# Patient Record
Sex: Male | Born: 2000 | Race: White | Hispanic: No | Marital: Single | Smoking: Never smoker
Health system: Southern US, Community
[De-identification: ages and names within clinical notes are randomized; demographics above are authoritative.]

## PROBLEM LIST (undated history)

## (undated) DIAGNOSIS — R569 Unspecified convulsions: Secondary | ICD-10-CM

## (undated) DIAGNOSIS — L03113 Cellulitis of right upper limb: Secondary | ICD-10-CM

## (undated) DIAGNOSIS — F909 Attention-deficit hyperactivity disorder, unspecified type: Secondary | ICD-10-CM

---
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
## 2019-01-07 ENCOUNTER — Ambulatory Visit (INDEPENDENT_AMBULATORY_CARE_PROVIDER_SITE_OTHER): Payer: Medicaid Other | Admitting: Orthopedic Surgery

## 2019-01-07 ENCOUNTER — Encounter: Payer: Self-pay | Admitting: Orthopedic Surgery

## 2019-01-07 ENCOUNTER — Ambulatory Visit (INDEPENDENT_AMBULATORY_CARE_PROVIDER_SITE_OTHER): Payer: Medicaid Other

## 2019-01-07 ENCOUNTER — Other Ambulatory Visit: Payer: Self-pay

## 2019-01-07 DIAGNOSIS — S82402A Unspecified fracture of shaft of left fibula, initial encounter for closed fracture: Secondary | ICD-10-CM

## 2019-01-07 DIAGNOSIS — S82202A Unspecified fracture of shaft of left tibia, initial encounter for closed fracture: Secondary | ICD-10-CM

## 2019-01-11 ENCOUNTER — Encounter: Payer: Self-pay | Admitting: Orthopedic Surgery

## 2019-01-11 NOTE — Progress Notes (Signed)
   Post-Op Visit Note   Patient: Brady Hayes           Date of Birth: 05-02-2000           MRN: [id number] Visit Date: 01/07/2019 PCP: Default, Provider, MD   Assessment & Plan:  Chief Complaint:  Chief Complaint  Patient presents with  . Left Leg - Follow-up   Visit Diagnoses:  1. Tibia/fibula fracture, left, closed, initial encounter     Plan: Patient is an 18 year old male who presents s/p left tibial IM nail on 12/16/2018.  Patient has been ambulating nonweightbearing.  His pain is improved significantly compared with the last office visit.  Has been taking aspirin twice a day with Robaxin.  He is not taking any pain medication.  He only takes occasional ibuprofen.  He has significant stiffness in the left ankle.  He also notes some mild knee pain and has 5 degrees of knee extension.  His x-rays look good today.  We will progress patient to partial weightbearing in a boot with 1 crutch.  He will increase his ankle range of motion exercises to 500 ankle pumps a day with a Thera-Band.  He has no calf tenderness.  Patient had some breakdown of the smaller incisions for the interlocking screws.  This is likely due to a reaction to the Vicryl suture.  The Vicryl sutures were identified and the incisions were anesthetized with 1% lidocaine.  Using small forceps and scissors, the sutures were removed and the incisions were approximated with gauze and bandages.  Patient will follow up in 1 week for recheck of incisions.  Follow-Up Instructions: No follow-ups on file.   Orders:  Orders Placed This Encounter  Procedures  . XR Tibia/Fibula Left   No orders of the defined types were placed in this encounter.   Imaging: No results found.  PMFS History: Patient Active Problem List   Diagnosis Date Noted  . Tibia/fibula fracture, left, closed, initial encounter 12/15/2018  . Appendicitis 06/13/2017   Past Medical History:  Diagnosis Date  . Attention deficit hyperactivity disorder  (ADHD)   . Cellulitis of right arm    lanced at MD office--?? caused by spider bite  . Seizures ([place])     History reviewed. No pertinent family history.  Past Surgical History:  Procedure Laterality Date  . LAPAROSCOPIC APPENDECTOMY N/A 06/13/2017   Procedure: APPENDECTOMY LAPAROSCOPIC;  Surgeon: Aviva Signs, MD;  Location: [facility];  Service: General;  Laterality: N/A;  . TIBIA IM NAIL INSERTION Left 12/16/2018   Procedure: INTRAMEDULLARY (IM) NAIL  LEFT TIBIAL/FIBULA;  Surgeon: Meredith Pel, MD;  Location: [place];  Service: Orthopedics;  Laterality: Left;   Social History   Occupational History  . Not on file  Tobacco Use  . Smoking status: Never Smoker  . Smokeless tobacco: Never Used  Substance and Sexual Activity  . Alcohol use: Not Currently  . Drug use: Not Currently  . Sexual activity: Not on file

## 2019-01-14 ENCOUNTER — Ambulatory Visit: Payer: Self-pay

## 2019-01-14 ENCOUNTER — Ambulatory Visit (INDEPENDENT_AMBULATORY_CARE_PROVIDER_SITE_OTHER): Payer: Medicaid Other | Admitting: Orthopedic Surgery

## 2019-01-14 ENCOUNTER — Other Ambulatory Visit: Payer: Self-pay

## 2019-01-14 ENCOUNTER — Encounter: Payer: Self-pay | Admitting: Orthopedic Surgery

## 2019-01-14 DIAGNOSIS — S82202A Unspecified fracture of shaft of left tibia, initial encounter for closed fracture: Secondary | ICD-10-CM

## 2019-01-14 DIAGNOSIS — S82402A Unspecified fracture of shaft of left fibula, initial encounter for closed fracture: Secondary | ICD-10-CM

## 2019-01-19 ENCOUNTER — Encounter: Payer: Self-pay | Admitting: Orthopedic Surgery

## 2019-01-19 NOTE — Progress Notes (Signed)
   Post-Op Visit Note   Patient: Brady Hayes           Date of Birth: [birth year]           MRN: [id number] Visit Date: 01/14/2019 PCP: Default, Provider, MD   Assessment & Plan:  Chief Complaint:  Chief Complaint  Patient presents with  . Left Leg - Follow-up   Visit Diagnoses:  1. Tibia/fibula fracture, left, closed, initial encounter     Plan: Patient is now 4 weeks out left tibial IM nail.  He heard a pop around the ankle.  On exam the syndesmosis is stable and radiographs show no significant change in fracture alignment.  Continue with progressive weightbearing in the fracture boot.  All incisions look reasonable.  4-week return for repeat radiographs and likely clinical release at that time negative Homans and no calf tenderness today.  Follow-Up Instructions: Return in about 4 weeks (around 02/11/2019).   Orders:  Orders Placed This Encounter  Procedures  . XR Tibia/Fibula Left   No orders of the defined types were placed in this encounter.   Imaging: No results found.  PMFS History: Patient Active Problem List   Diagnosis Date Noted  . Tibia/fibula fracture, left, closed, initial encounter 12/15/2018  . Appendicitis 06/13/2017   Past Medical History:  Diagnosis Date  . Attention deficit hyperactivity disorder (ADHD)   . Cellulitis of right arm    lanced at MD office--?? caused by spider bite  . Seizures ([place])     History reviewed. No pertinent family history.  Past Surgical History:  Procedure Laterality Date  . LAPAROSCOPIC APPENDECTOMY N/A 06/13/2017   Procedure: APPENDECTOMY LAPAROSCOPIC;  Surgeon: Aviva Signs, MD;  Location: [facility];  Service: General;  Laterality: N/A;  . TIBIA IM NAIL INSERTION Left 12/16/2018   Procedure: INTRAMEDULLARY (IM) NAIL  LEFT TIBIAL/FIBULA;  Surgeon: Meredith Pel, MD;  Location: [place];  Service: Orthopedics;  Laterality: Left;   Social History   Occupational History  . Not on file  Tobacco Use  . Smoking  status: Never Smoker  . Smokeless tobacco: Never Used  Substance and Sexual Activity  . Alcohol use: Not Currently  . Drug use: Not Currently  . Sexual activity: Not on file

## 2019-01-24 ENCOUNTER — Other Ambulatory Visit: Payer: Self-pay

## 2019-01-24 ENCOUNTER — Ambulatory Visit: Payer: Medicaid Other | Attending: Internal Medicine

## 2019-01-24 DIAGNOSIS — Z20828 Contact with and (suspected) exposure to other viral communicable diseases: Secondary | ICD-10-CM | POA: Diagnosis not present

## 2019-01-24 DIAGNOSIS — Z20822 Contact with and (suspected) exposure to covid-19: Secondary | ICD-10-CM

## 2019-01-25 LAB — NOVEL CORONAVIRUS, NAA: SARS-CoV-2, NAA: NOT DETECTED

## 2019-02-18 ENCOUNTER — Other Ambulatory Visit: Payer: Self-pay

## 2019-02-18 ENCOUNTER — Ambulatory Visit (INDEPENDENT_AMBULATORY_CARE_PROVIDER_SITE_OTHER): Payer: Medicaid Other | Admitting: Orthopedic Surgery

## 2019-02-18 ENCOUNTER — Ambulatory Visit (INDEPENDENT_AMBULATORY_CARE_PROVIDER_SITE_OTHER): Payer: Medicaid Other

## 2019-02-18 DIAGNOSIS — S82402A Unspecified fracture of shaft of left fibula, initial encounter for closed fracture: Secondary | ICD-10-CM

## 2019-02-18 DIAGNOSIS — S82202A Unspecified fracture of shaft of left tibia, initial encounter for closed fracture: Secondary | ICD-10-CM

## 2019-02-24 ENCOUNTER — Encounter: Payer: Self-pay | Admitting: Orthopedic Surgery

## 2019-02-24 NOTE — Progress Notes (Signed)
   Post-Op Visit Note   Patient: Brady Hayes           Date of Birth: [birth year]           MRN: [id number] Visit Date: 02/18/2019 PCP: Default, Provider, MD   Assessment & Plan:  Chief Complaint:  Chief Complaint  Patient presents with  . Left Leg - Follow-up   Visit Diagnoses:  1. Tibia/fibula fracture, left, closed, initial encounter     Plan: [place] is a patient is now about 9 weeks out left tibial IM nail.  He is doing well.  On exam he has excellent range of motion of the knee and ankle.  He is walking full weightbearing with minimal limp.  Incisions have healed nicely.  Radiographs look good.  Plan is to continue weightbearing as tolerated.  Mild discomfort with increased activity should improve over time follow-up with [place] as needed.  No calf tenderness and negative Homans today.  Follow-Up Instructions: No follow-ups on file.   Orders:  Orders Placed This Encounter  Procedures  . XR Tibia/Fibula Left   No orders of the defined types were placed in this encounter.   Imaging: No results found.  PMFS History: Patient Active Problem List   Diagnosis Date Noted  . Tibia/fibula fracture, left, closed, initial encounter 12/15/2018  . Appendicitis 06/13/2017   Past Medical History:  Diagnosis Date  . Attention deficit hyperactivity disorder (ADHD)   . Cellulitis of right arm    lanced at MD office--?? caused by spider bite  . Seizures ([facility])     History reviewed. No pertinent family history.  Past Surgical History:  Procedure Laterality Date  . LAPAROSCOPIC APPENDECTOMY N/A 06/13/2017   Procedure: APPENDECTOMY LAPAROSCOPIC;  Surgeon: Franky Macho, MD;  Location: [facility];  Service: General;  Laterality: N/A;  . TIBIA IM NAIL INSERTION Left 12/16/2018   Procedure: INTRAMEDULLARY (IM) NAIL  LEFT TIBIAL/FIBULA;  Surgeon: Cammy Copa, MD;  Location: [facility];  Service: Orthopedics;  Laterality: Left;   Social History   Occupational History  . Not on file    Tobacco Use  . Smoking status: Never Smoker  . Smokeless tobacco: Never Used  Substance and Sexual Activity  . Alcohol use: Not Currently  . Drug use: Not Currently  . Sexual activity: Not on file

## 2019-04-04 ENCOUNTER — Ambulatory Visit: Payer: Medicaid Other | Attending: Internal Medicine

## 2019-04-04 ENCOUNTER — Other Ambulatory Visit: Payer: Self-pay

## 2019-04-04 DIAGNOSIS — Z20822 Contact with and (suspected) exposure to covid-19: Secondary | ICD-10-CM

## 2019-04-05 LAB — NOVEL CORONAVIRUS, NAA: SARS-CoV-2, NAA: DETECTED — AB

## 2019-05-15 ENCOUNTER — Telehealth: Payer: Self-pay | Admitting: Orthopedic Surgery

## 2019-05-15 NOTE — Telephone Encounter (Signed)
Received call from patient. He is needing to get copy of his records for the [employer]. I advised him, need to sign release form and I would have his records ready for him. Callback [phone]

## 2019-10-04 DIAGNOSIS — B372 Candidiasis of skin and nail: Secondary | ICD-10-CM | POA: Diagnosis not present

## 2019-10-04 DIAGNOSIS — R21 Rash and other nonspecific skin eruption: Secondary | ICD-10-CM | POA: Diagnosis not present

## 2020-02-27 ENCOUNTER — Other Ambulatory Visit: Payer: Medicaid Other

## 2020-02-27 DIAGNOSIS — Z20822 Contact with and (suspected) exposure to covid-19: Secondary | ICD-10-CM | POA: Diagnosis not present

## 2020-02-29 LAB — NOVEL CORONAVIRUS, NAA: SARS-CoV-2, NAA: DETECTED — AB

## 2020-02-29 LAB — SARS-COV-2, NAA 2 DAY TAT

## 2021-03-03 ENCOUNTER — Other Ambulatory Visit: Payer: Self-pay

## 2021-03-03 ENCOUNTER — Ambulatory Visit (INDEPENDENT_AMBULATORY_CARE_PROVIDER_SITE_OTHER): Payer: Medicaid Other | Admitting: Family Medicine

## 2021-03-03 ENCOUNTER — Encounter: Payer: Self-pay | Admitting: Family Medicine

## 2021-03-03 DIAGNOSIS — R21 Rash and other nonspecific skin eruption: Secondary | ICD-10-CM | POA: Diagnosis not present

## 2021-03-03 MED ORDER — BETAMETHASONE VALERATE 0.1 % EX OINT: 1.0000 "application " | TOPICAL_OINTMENT | Freq: Two times a day (BID) | CUTANEOUS | 2 refills | Status: DC

## 2021-03-03 NOTE — Progress Notes (Signed)
Subjective:  Patient ID: Brady Hayes, male    DOB: [birth year]  Age: 21 y.o. MRN: [id number]  CC: Establish care; rash  HPI Brady Hayes is a 21 y.o. male presents to the clinic today to establish care with me.  He complains of persistent rash.  Patient states that he has had a rash on his lower abdomen for over a year.  He was previously treated at [facility] urgent care.  Was thought to be candidiasis and he was treated with antifungals.  He had no improvement with the medication that he was prescribed.  He states that this has slowly improved over time but has not resolved.  He has an area that is raised, dry and erythematous on his lower abdomen.  He also has a raised area of erythema at the left antecubital fossa.  It is not particular troublesome or itchy.  However, it is still persistent.  Patient also complains of transient pains of the low back radiating anteriorly.  Last for seconds and then resolves.  Denies chest pain or shortness of breath.  No other reported symptoms.  No other complaints at this time.   PMH, Surgical Hx, Family Hx, Social History reviewed and updated as below.  Past Medical History:  Diagnosis Date   Seizures ([place])    As a child; no current meds   Past Surgical History:  Procedure Laterality Date   LAPAROSCOPIC APPENDECTOMY N/A 06/13/2017   Procedure: APPENDECTOMY LAPAROSCOPIC;  Surgeon: Aviva Signs, MD;  Location: [facility];  Service: General;  Laterality: N/A;   TIBIA IM NAIL INSERTION Left 12/16/2018   Procedure: INTRAMEDULLARY (IM) NAIL  LEFT TIBIAL/FIBULA;  Surgeon: Meredith Pel, MD;  Location: [place];  Service: Orthopedics;  Laterality: Left;   Family History  Problem Relation Age of Onset   Diabetes Mother    Heart disease Father    Diabetes Father    Diabetes Maternal Grandmother    Diabetes Maternal Grandfather    Social History   Tobacco Use   Smoking status: Never   Smokeless tobacco: Never  Substance Use Topics   Alcohol use:  Not Currently    Review of Systems Per HPI Objective:   Today's Vitals: BP 130/78    Pulse 82    Temp 97.7 F (36.5 C)    Ht 5\' 8"  (1.727 m)    Wt 219 lb 9.6 oz (99.6 kg)    SpO2 99%    BMI 33.39 kg/m   Physical Exam Vitals and nursing note reviewed.  Constitutional:      General: He is not in acute distress.    Appearance: Normal appearance. He is obese. He is not ill-appearing.  HENT:     Head: Normocephalic and atraumatic.  Eyes:     General:        Right eye: No discharge.        Left eye: No discharge.     Conjunctiva/sclera: Conjunctivae normal.  Cardiovascular:     Rate and Rhythm: Normal rate and regular rhythm.     Heart sounds: No murmur heard. Pulmonary:     Effort: Pulmonary effort is normal.     Breath sounds: Normal breath sounds. No wheezing, rhonchi or rales.  Abdominal:       Comments: Dry eye, scaly area with erythema at the labeled location.  Skin:    Comments: Raised papular rash noted at the left antecubital fossa.  Neurological:     Mental Status: He is alert.  Psychiatric:  Mood and Affect: Mood normal.        Behavior: Behavior normal.     Assessment & Plan:   Problem List Items Addressed This Visit       Musculoskeletal and Integument   Rash    Suspect atopic dermatitis.  Treating with betamethasone.       Meds ordered this encounter  Medications   betamethasone valerate ointment (VALISONE) 0.1 %    Sig: Apply 1 application topically 2 (two) times daily. Do not use for more than 2 weeks consecutively.    Dispense:  30 g    Refill:  2     Follow-up: Return in about 1 year (around 03/03/2022), or if symptoms worsen or fail to improve.  [place]

## 2021-03-03 NOTE — Patient Instructions (Signed)
Use the medication as prescribed.  You can also use it for the areas on your hand.  Follow up annually or sooner if needed.  Take care  Dr. Adriana Simas

## 2021-03-03 NOTE — Assessment & Plan Note (Signed)
Suspect atopic dermatitis.  Treating with betamethasone.

## 2021-03-05 IMAGING — DX DG TIBIA/FIBULA 2V*L*
4 series · 4 of 4 positions shown · non-contrast
Comparison: None.

CLINICAL DATA: Item nil placement in the left tibia

EXAM:
DG C-ARM 1-60 MIN; LEFT TIBIA AND FIBULA - 2 VIEW
FLUOROSCOPY TIME:  Fluoroscopy Time:  2 minutes 19 seconds

[tibia ap (1 of 2)]
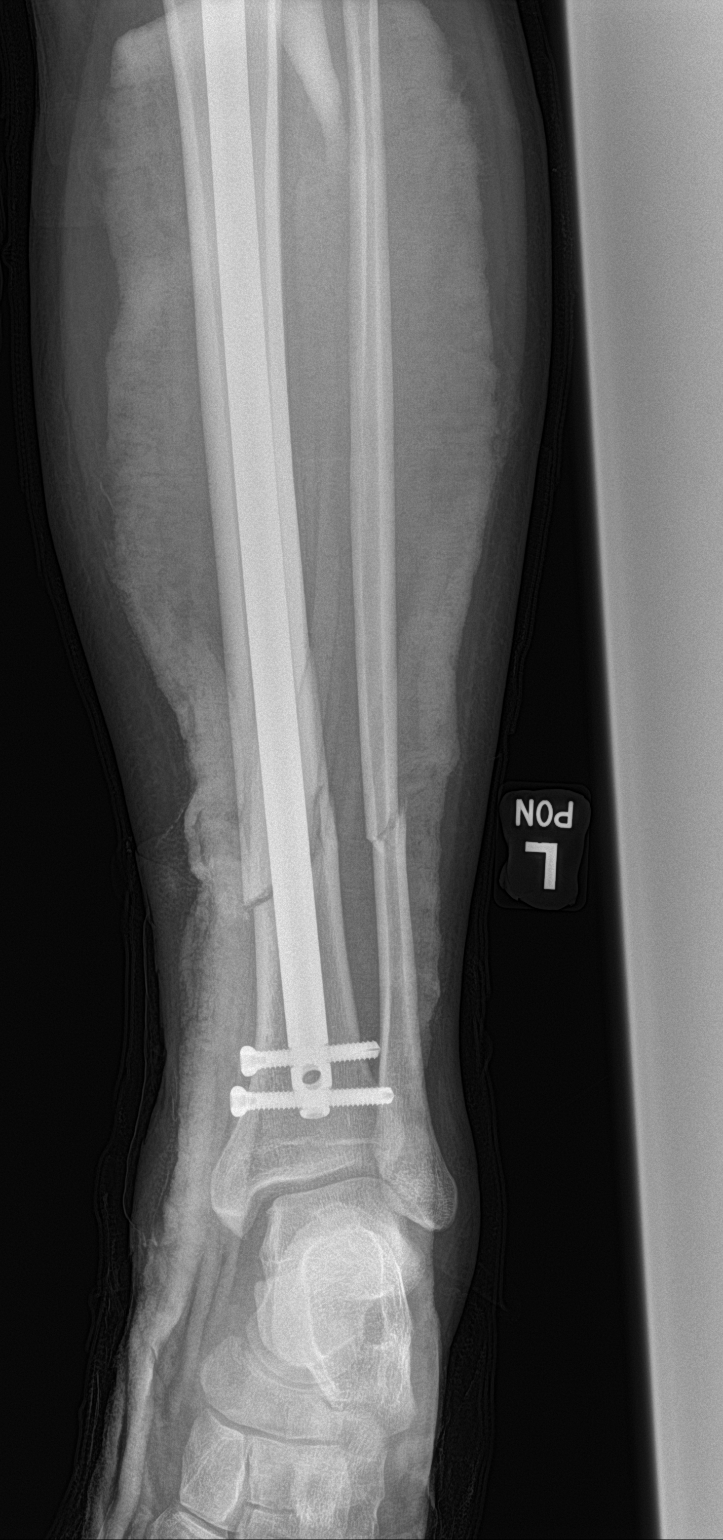

[tibia ap (2 of 2)]
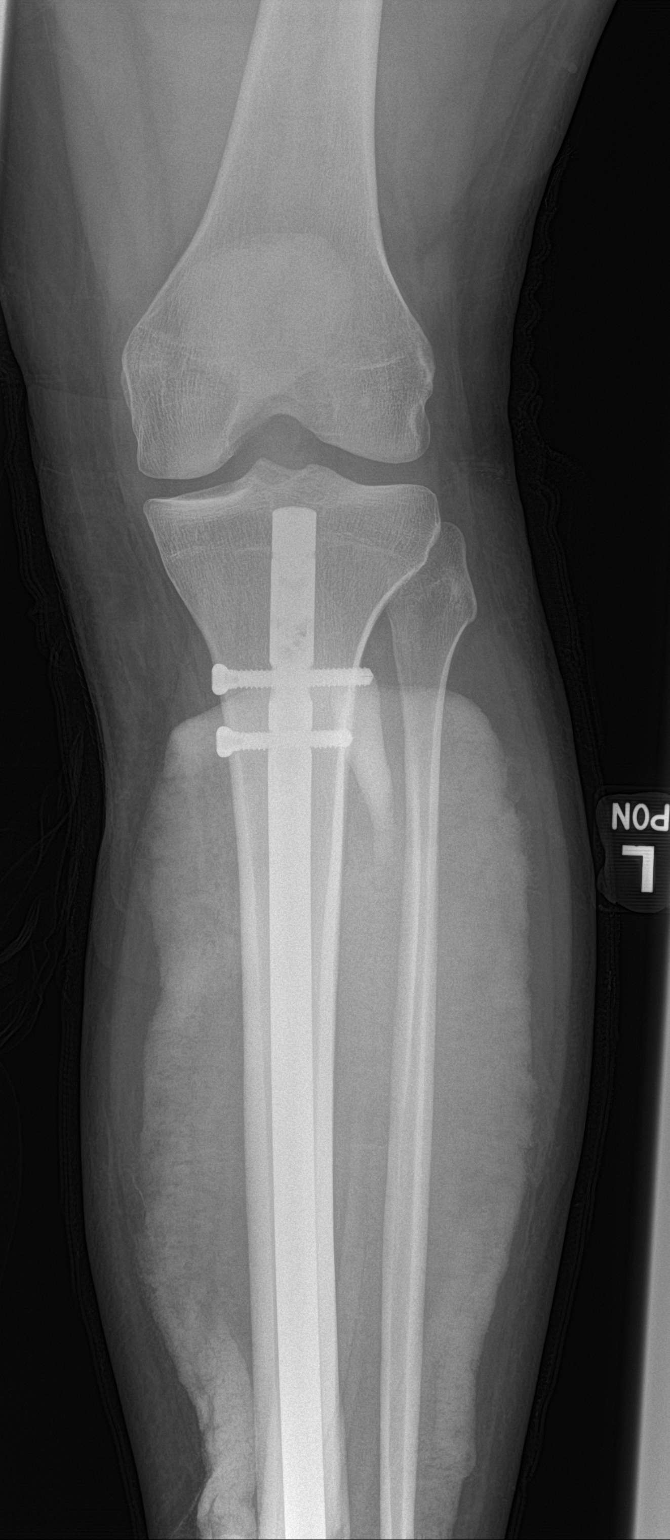

[tibia lat (1 of 2)]
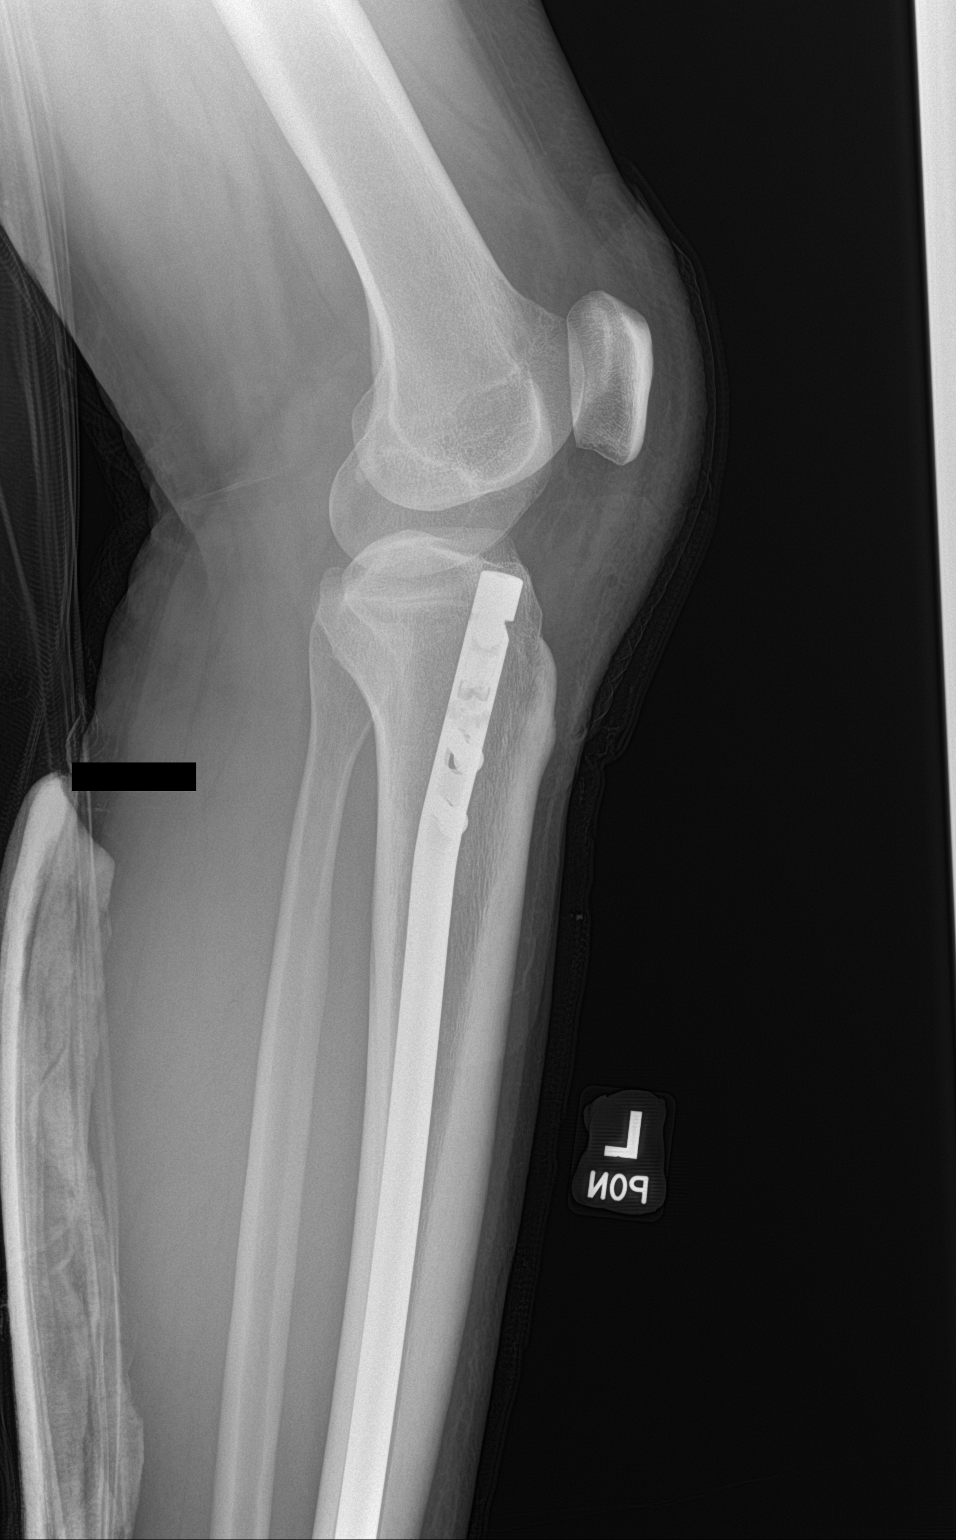

[tibia lat (2 of 2)]
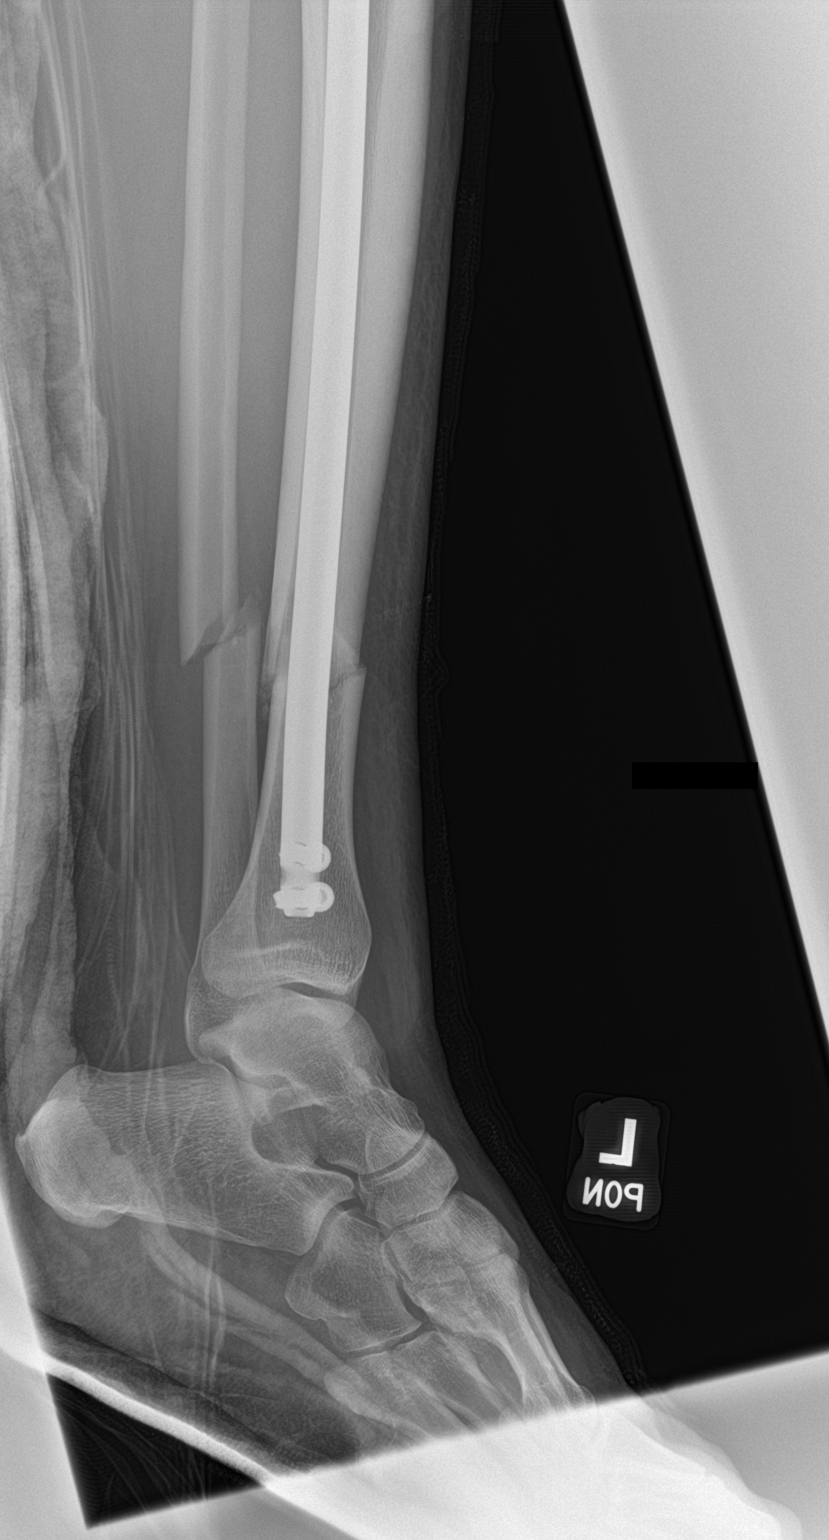

[4 of 4 positions shown; findings below may reference images not displayed]

FINDINGS: Multiple intraoperative fluoroscopic spot images are provided.
Oblique distal tibial diaphysis fracture transfixed with a
intramedullary nail and interlocking screws in near anatomic
alignment. Mildly displaced oblique distal fibular diaphysis
fracture with 4 mm of anterior displacement.
IMPRESSION: Intraoperative localization.

## 2021-03-05 IMAGING — RF DG C-ARM 1-60 MIN
1 series · 6 of 6 positions shown · non-contrast
Comparison: None.

CLINICAL DATA: Item nil placement in the left tibia

EXAM:
DG C-ARM 1-60 MIN; LEFT TIBIA AND FIBULA - 2 VIEW
FLUOROSCOPY TIME:  Fluoroscopy Time:  2 minutes 19 seconds

[Series 1: run · 6 of 6 slices shown]
[im 1/6]
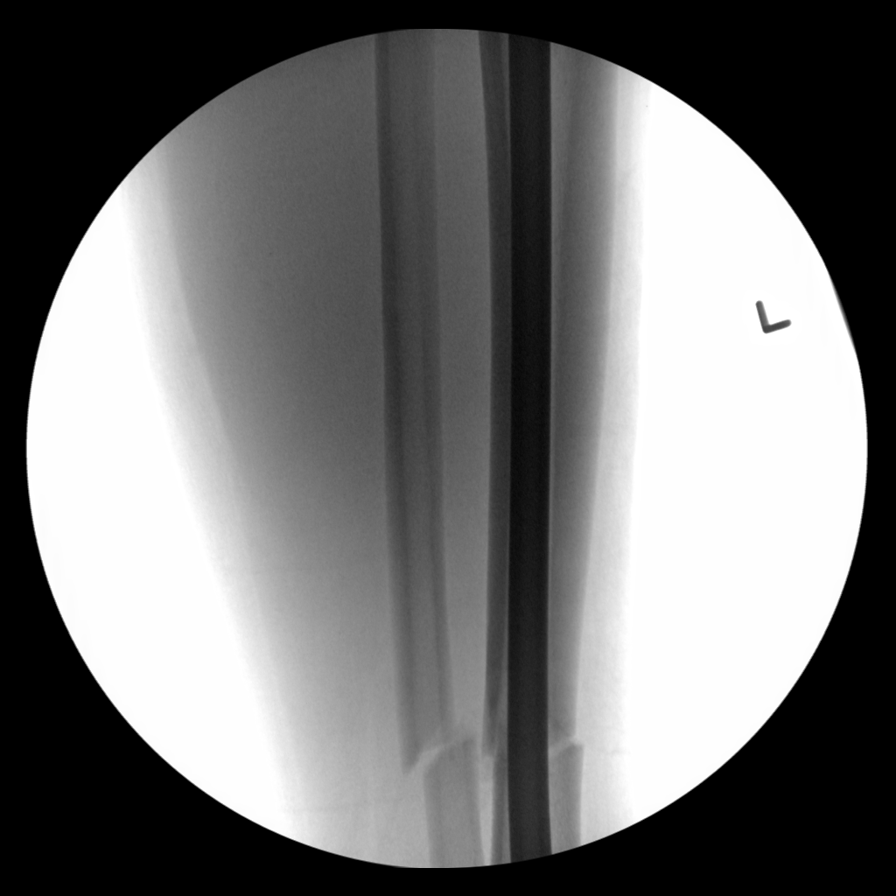
[im 2/6]
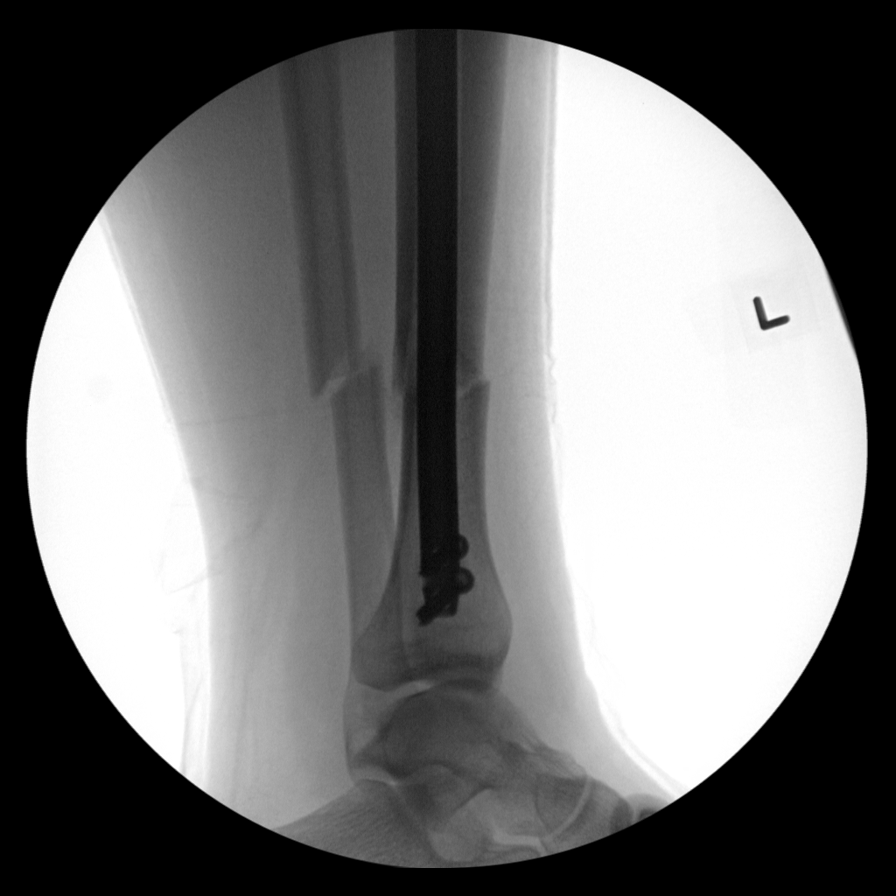
[im 3/6]
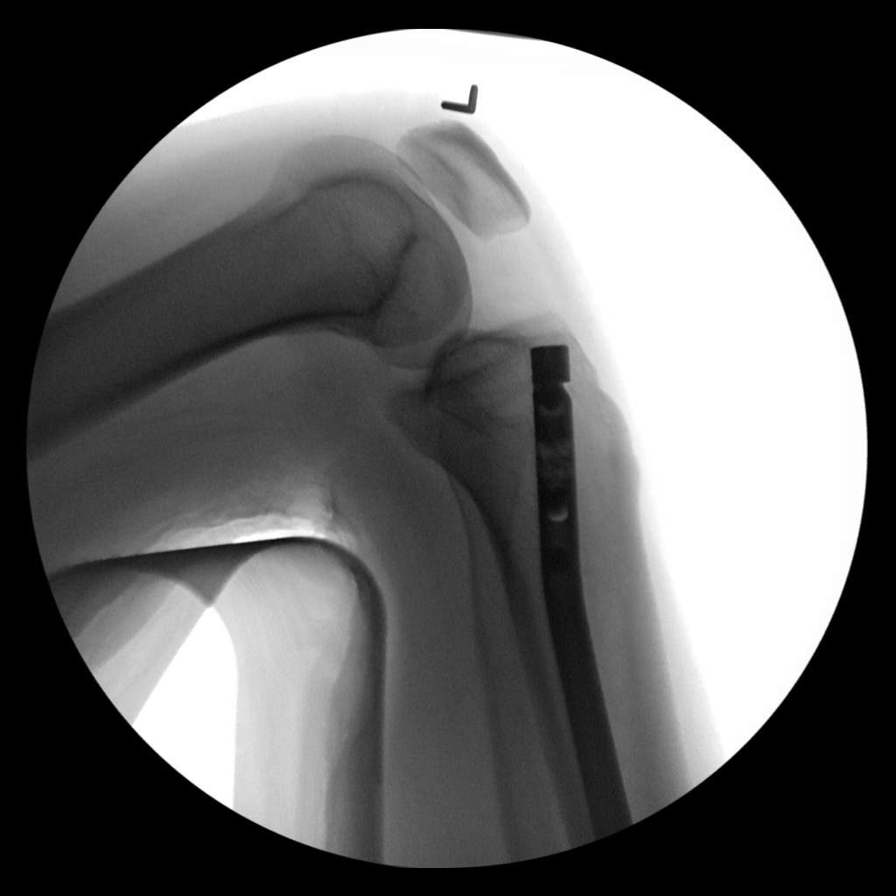
[im 4/6]
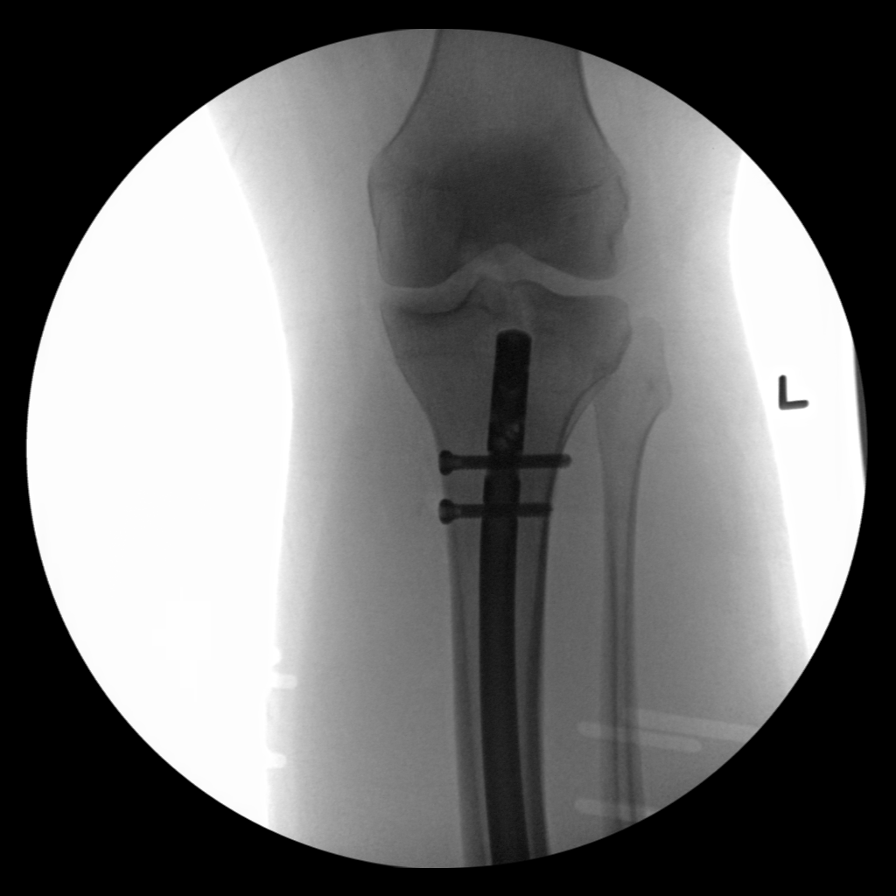
[im 5/6]
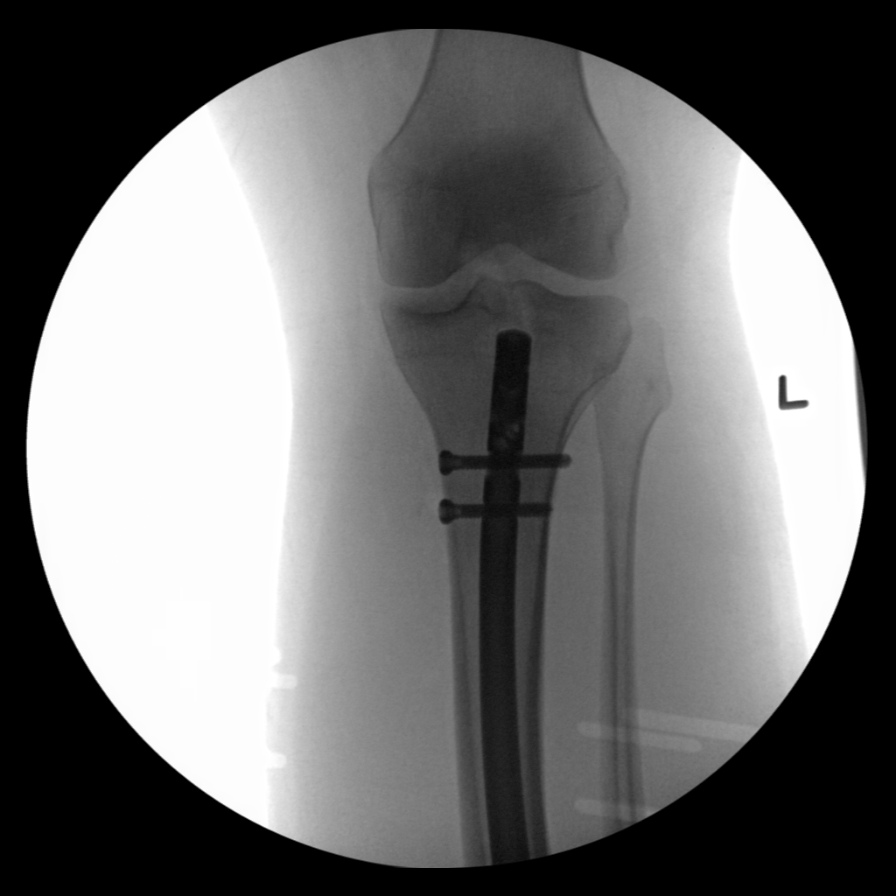
[im 6/6]
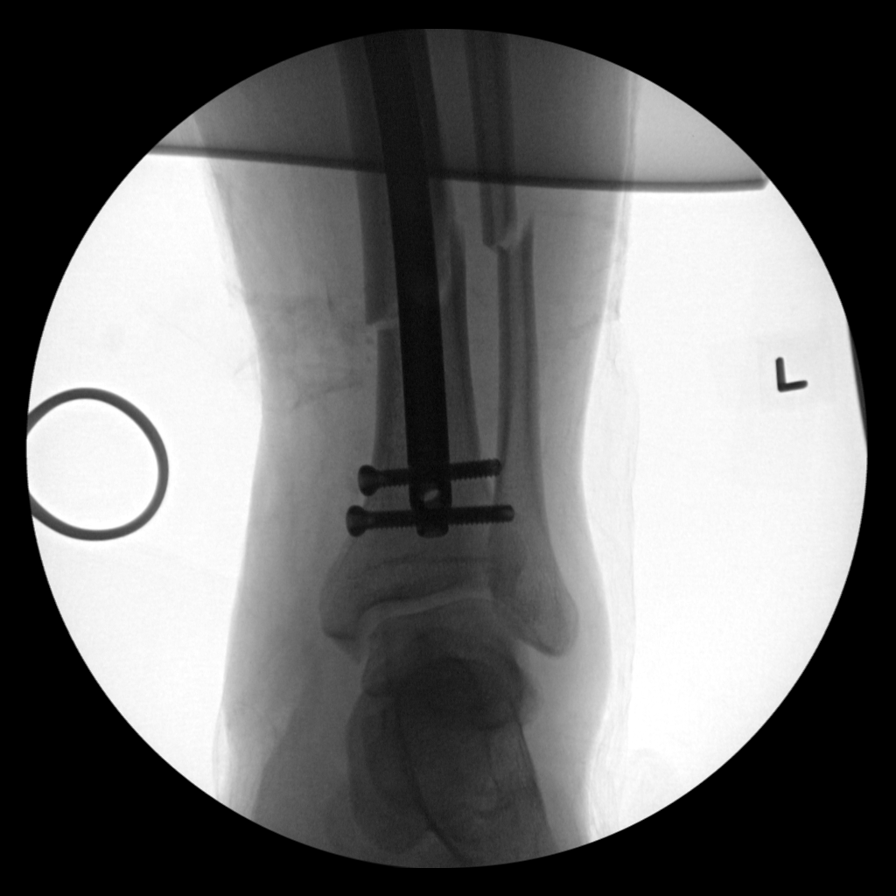

[6 of 6 positions shown; findings below may reference images not displayed]

FINDINGS: Multiple intraoperative fluoroscopic spot images are provided.
Oblique distal tibial diaphysis fracture transfixed with a
intramedullary nail and interlocking screws in near anatomic
alignment. Mildly displaced oblique distal fibular diaphysis
fracture with 4 mm of anterior displacement.
IMPRESSION: Intraoperative localization.

## 2021-09-07 ENCOUNTER — Emergency Department (HOSPITAL_COMMUNITY): Payer: 59

## 2021-09-07 ENCOUNTER — Encounter (HOSPITAL_COMMUNITY): Payer: Self-pay

## 2021-09-07 ENCOUNTER — Other Ambulatory Visit: Payer: Self-pay

## 2021-09-07 ENCOUNTER — Emergency Department (HOSPITAL_COMMUNITY)
Admission: EM | Admit: 2021-09-07 | Discharge: 2021-09-07 | Disposition: A | Discharge status: Home / Self Care | Payer: 59 | Attending: Emergency Medicine | Admitting: Emergency Medicine

## 2021-09-07 DIAGNOSIS — N200 Calculus of kidney: Secondary | ICD-10-CM

## 2021-09-07 DIAGNOSIS — N132 Hydronephrosis with renal and ureteral calculous obstruction: Secondary | ICD-10-CM | POA: Insufficient documentation

## 2021-09-07 DIAGNOSIS — R109 Unspecified abdominal pain: Secondary | ICD-10-CM | POA: Diagnosis present

## 2021-09-07 LAB — URINALYSIS, ROUTINE W REFLEX MICROSCOPIC
Bacteria, UA: NONE SEEN
Bilirubin Urine: NEGATIVE
Glucose, UA: NEGATIVE mg/dL
Ketones, ur: NEGATIVE mg/dL
Leukocytes,Ua: NEGATIVE
Nitrite: NEGATIVE
Protein, ur: 30 mg/dL — AB
RBC / HPF: >50 RBC/hpf — ABNORMAL HIGH (ref 0–5)
Specific Gravity, Urine: 1.031 — ABNORMAL HIGH (ref 1.005–1.030)
pH: 5 (ref 5.0–8.0)

## 2021-09-07 LAB — BASIC METABOLIC PANEL
Anion gap: 6 (ref 5–15)
BUN: 13 mg/dL (ref 6–20)
CO2: 26 mmol/L (ref 22–32)
Calcium: 9 mg/dL (ref 8.9–10.3)
Chloride: 103 mmol/L (ref 98–111)
Creatinine, Ser: 0.99 mg/dL (ref 0.61–1.24)
GFR, Estimated: >60 mL/min (ref >60)
Glucose, Bld: 138 mg/dL — ABNORMAL HIGH (ref 70–99)
Potassium: 3.9 mmol/L (ref 3.5–5.1)
Sodium: 135 mmol/L (ref 135–145)

## 2021-09-07 LAB — CBC WITH DIFFERENTIAL/PLATELET
Abs Immature Granulocytes: 0.03 10*3/uL (ref 0.00–0.07)
Basophils Absolute: 0.1 10*3/uL (ref 0.0–0.1)
Basophils Relative: 1 %
Eosinophils Absolute: 0.2 10*3/uL (ref 0.0–0.5)
Eosinophils Relative: 3 %
HCT: 41.5 % (ref 39.0–52.0)
Hemoglobin: 14.3 g/dL (ref 13.0–17.0)
Immature Granulocytes: 0 %
Lymphocytes Relative: 40 %
Lymphs Abs: 3.2 10*3/uL (ref 0.7–4.0)
MCH: 28.9 pg (ref 26.0–34.0)
MCHC: 34.5 g/dL (ref 30.0–36.0)
MCV: 84 fL (ref 80.0–100.0)
Monocytes Absolute: 0.6 10*3/uL (ref 0.1–1.0)
Monocytes Relative: 7 %
Neutro Abs: 3.9 10*3/uL (ref 1.7–7.7)
Neutrophils Relative %: 49 %
Platelets: 228 10*3/uL (ref 150–400)
RBC: 4.94 MIL/uL (ref 4.22–5.81)
RDW: 12.1 % (ref 11.5–15.5)
WBC: 8 10*3/uL (ref 4.0–10.5)
nRBC: 0 % (ref 0.0–0.2)

## 2021-09-07 MED ORDER — KETOROLAC TROMETHAMINE 30 MG/ML IJ SOLN
30.0000 mg | Freq: Once | INTRAMUSCULAR | Status: AC
  Administered 2021-09-07: 30 mg via INTRAVENOUS
  Filled 2021-09-07: qty 1

## 2021-09-07 MED ORDER — HYDROMORPHONE HCL 1 MG/ML IJ SOLN
1.0000 mg | Freq: Once | INTRAMUSCULAR | Status: AC
  Administered 2021-09-07: 1 mg via INTRAVENOUS
  Filled 2021-09-07: qty 1

## 2021-09-07 MED ORDER — ONDANSETRON HCL 4 MG PO TABS: 4.0000 mg | ORAL_TABLET | Freq: Four times a day (QID) | ORAL | 0 refills | Status: DC

## 2021-09-07 MED ORDER — OXYCODONE-ACETAMINOPHEN 5-325 MG PO TABS: 1.0000 | ORAL_TABLET | ORAL | 0 refills | Status: AC | PRN

## 2021-09-07 MED ORDER — TAMSULOSIN HCL 0.4 MG PO CAPS: 0.4000 mg | ORAL_CAPSULE | Freq: Every day | ORAL | 0 refills | Status: DC

## 2021-09-07 MED ORDER — ONDANSETRON HCL 4 MG/2ML IJ SOLN
4.0000 mg | Freq: Once | INTRAMUSCULAR | Status: AC
  Administered 2021-09-07: 4 mg via INTRAVENOUS
  Filled 2021-09-07: qty 2

## 2021-09-07 NOTE — ED Provider Notes (Signed)
[facility] EMERGENCY DEPARTMENT Provider Note   CSN: [id number] Arrival date & time: 09/07/21  1030     History  Chief Complaint  Patient presents with   Flank Pain    Brady Hayes is a 21 y.o. male.   Flank Pain Pertinent negatives include no chest pain, no abdominal pain, no headaches and no shortness of breath.       Brady Hayes is a 21 y.o. male who presents to the Emergency Department complaining of left flank pain of sudden onset earlier today.  He describes sharp pain to his left back that is constant.  Slightly feels like it is radiating toward his lower abdomen.  Some discomfort with urinating, but able to void.  No burning with urination, swelling or pain to his penis or testicles.  Symptoms associated with nausea, no vomiting.  States pain is so severe that he feels like he is going to pass out.  Both parents have history of multiple kidney stones.     Home Medications Prior to Admission medications   Medication Sig Start Date End Date Taking? Authorizing Provider  ondansetron (ZOFRAN) 4 MG tablet Take 1 tablet (4 mg total) by mouth every 6 (six) hours. As needed for nausea/vomiting 09/07/21  Yes Ioane Bhola, PA-C  oxyCODONE-acetaminophen (PERCOCET/ROXICET) 5-325 MG tablet Take 1 tablet by mouth every 4 (four) hours as needed. 09/07/21  Yes Uday Jantz, PA-C  tamsulosin (FLOMAX) 0.4 MG CAPS capsule Take 1 capsule (0.4 mg total) by mouth daily. 09/07/21  Yes Jasma Seevers, PA-C      Allergies    Patient has no known allergies.    Review of Systems   Review of Systems  Constitutional:  Negative for chills and fever.  Respiratory:  Negative for shortness of breath.   Cardiovascular:  Negative for chest pain.  Gastrointestinal:  Positive for nausea. Negative for abdominal pain, diarrhea and vomiting.  Genitourinary:  Positive for difficulty urinating and flank pain. Negative for decreased urine volume, hematuria, penile discharge, penile pain, penile  swelling, scrotal swelling and testicular pain.  Neurological:  Negative for dizziness, syncope and headaches.    Physical Exam Updated Vital Signs BP 117/74   Pulse 66   Temp 97.7 F (36.5 C) (Oral)   Resp 18   Ht 5\' 9"  (1.753 m)   Wt 99.8 kg   SpO2 96%   BMI 32.49 kg/m  Physical Exam Vitals and nursing note reviewed.  Constitutional:      Appearance: He is not toxic-appearing.     Comments: Patient is uncomfortable appearing, pacing around in the exam room  Cardiovascular:     Rate and Rhythm: Normal rate and regular rhythm.     Pulses: Normal pulses.  Pulmonary:     Effort: Pulmonary effort is normal. No respiratory distress.     Breath sounds: Normal breath sounds.  Chest:     Chest wall: No tenderness.  Abdominal:     Palpations: Abdomen is soft.     Tenderness: There is no right CVA tenderness or left CVA tenderness.  Musculoskeletal:        General: Normal range of motion.  Skin:    General: Skin is warm.     Capillary Refill: Capillary refill takes less than 2 seconds.     Findings: No rash.  Neurological:     General: No focal deficit present.     Mental Status: He is alert.     Sensory: No sensory deficit.  Motor: No weakness.     ED Results / Procedures / Treatments   Labs (all labs ordered are listed, but only abnormal results are displayed) Labs Reviewed  URINALYSIS, ROUTINE W REFLEX MICROSCOPIC - Abnormal; Notable for the following components:      Result Value   APPearance HAZY (*)    Specific Gravity, Urine 1.031 (*)    Hgb urine dipstick LARGE (*)    Protein, ur 30 (*)    RBC / HPF >50 (*)    All other components within normal limits  BASIC METABOLIC PANEL - Abnormal; Notable for the following components:   Glucose, Bld 138 (*)    All other components within normal limits  CBC WITH DIFFERENTIAL/PLATELET    EKG None  Radiology CT Renal Stone Study  Result Date: 09/07/2021 CLINICAL DATA:  LEFT flank pain for 1 day without  hematuria, question kidney stone EXAM: CT ABDOMEN AND PELVIS WITHOUT CONTRAST TECHNIQUE: Multidetector CT imaging of the abdomen and pelvis was performed following the standard protocol without IV contrast. RADIATION DOSE REDUCTION: This exam was performed according to the departmental dose-optimization program which includes automated exposure control, adjustment of the mA and/or kV according to patient size and/or use of iterative reconstruction technique. COMPARISON:  06/13/2017 FINDINGS: Lower chest: Lung bases clear Hepatobiliary: Diffuse fatty infiltration of liver with focal sparing adjacent to gallbladder fossa. No hepatic mass. Gallbladder unremarkable. Pancreas: Normal appearance Spleen: Normal appearance Adrenals/Urinary Tract: Adrenal glands normal appearance. LEFT hydronephrosis and hydroureter secondary to a 3 mm distal LEFT ureteral calculus image 87. Mild perinephric edema LEFT kidney. RIGHT kidney and RIGHT ureter unremarkable. No renal masses or additional urinary tract calculi. Bladder unremarkable. Stomach/Bowel: Appendix surgically absent. Single sigmoid diverticulum. Stomach and bowel loops otherwise normal appearance. Vascular/Lymphatic: Aorta normal caliber.  No adenopathy. Reproductive: Unremarkable prostate gland and seminal vesicles Other: No free air or free fluid.  No hernia. Musculoskeletal: Unremarkable IMPRESSION: LEFT hydronephrosis and hydroureter secondary to a 3 mm distal LEFT ureteral calculus. Fatty infiltration of liver. Electronically Signed   By: Ulyses Southward M.D.   On: 09/07/2021 13:03    Procedures Procedures    Medications Ordered in ED Medications  HYDROmorphone (DILAUDID) injection 1 mg (1 mg Intravenous Given 09/07/21 1218)  ondansetron (ZOFRAN) injection 4 mg (4 mg Intravenous Given 09/07/21 1217)  ketorolac (TORADOL) 30 MG/ML injection 30 mg (30 mg Intravenous Given 09/07/21 1254)    ED Course/ Medical Decision Making/ A&P                           Medical  Decision Making Patient here for evaluation of sudden onset of left flank pain.  Has some pain with voiding as well.  No pain swelling of the testicles or penis.  No history of prior kidney stones but both parents have had multiple kidney stones in the past  On exam, patient appears uncomfortable, pacing around in the exam room.  Differential would include kidney stone, pyelonephritis, UTI, acute urinary retention, musculoskeletal pain.  Clinically I have a high suspicion for kidney stone.  We will proceed with labs, urinalysis and CT renal stone study  Amount and/or Complexity of Data Reviewed Labs: ordered.    Details: Labs show no evidence of leukocytosis, hemoglobin reassuring.  Chemistries without significant derangement.  Urinalysis shows large blood with greater than 50 red cells and no evidence of infection Radiology: ordered.    Details: CT renal stone study shows left-sided hydronephrosis and hydroureter  with a 3 mm distal left ureteral calculus Discussion of management or test interpretation with external provider(s): Patient given IV hydromorphone, Zofran and Toradol here.  He is pain-free at time of second evaluation.  No further nausea and no vomiting during ER stay.  Discussed findings with the patient and family who is at bedside.  Who is agreeable to symptomatic treatment and will follow-up closely with urology if his symptoms are not improving.  He was given return precautions.  Risk Prescription drug management.           Final Clinical Impression(s) / ED Diagnoses Final diagnoses:  Kidney stone    Rx / DC Orders ED Discharge Orders          Ordered    tamsulosin (FLOMAX) 0.4 MG CAPS capsule  Daily        09/07/21 1333    oxyCODONE-acetaminophen (PERCOCET/ROXICET) 5-325 MG tablet  Every 4 hours PRN        09/07/21 1333    ondansetron (ZOFRAN) 4 MG tablet  Every 6 hours        09/07/21 1333              Pauline Aus, PA-C 09/07/21 1349    Gloris Manchester, MD 09/07/21 1811

## 2021-09-07 NOTE — Discharge Instructions (Signed)
Your work-up today shows that you have a 3 mm kidney stone on the left this is likely what is causing your pain.  Take the medication as directed.  The stone is small enough that hopefully you will pass it in the next few days, if not please call the urology group listed to arrange a follow-up appointment.  Return to the emergency department for any new or worsening symptoms.

## 2021-09-07 NOTE — ED Triage Notes (Signed)
Patient with complaints of left flank pain that started this morning.

## 2021-10-28 ENCOUNTER — Other Ambulatory Visit (HOSPITAL_COMMUNITY)
Admission: RE | Admit: 2021-10-28 | Discharge: 2021-10-28 | Disposition: A | Discharge status: Home / Self Care | Payer: 59 | Source: Ambulatory Visit | Attending: Nurse Practitioner | Admitting: Nurse Practitioner

## 2021-10-28 ENCOUNTER — Ambulatory Visit (INDEPENDENT_AMBULATORY_CARE_PROVIDER_SITE_OTHER): Payer: 59 | Admitting: Nurse Practitioner

## 2021-10-28 VITALS — BP 111/75 | HR 84 | Temp 97.7°F | Ht 69.0 in | Wt 210.0 lb

## 2021-10-28 DIAGNOSIS — R1030 Lower abdominal pain, unspecified: Secondary | ICD-10-CM

## 2021-10-28 DIAGNOSIS — N200 Calculus of kidney: Secondary | ICD-10-CM | POA: Insufficient documentation

## 2021-10-28 LAB — COMPREHENSIVE METABOLIC PANEL
ALT: 85 U/L — ABNORMAL HIGH (ref 0–44)
AST: 34 U/L (ref 15–41)
Albumin: 4.8 g/dL (ref 3.5–5.0)
Alkaline Phosphatase: 45 U/L (ref 38–126)
Anion gap: 10 (ref 5–15)
BUN: 12 mg/dL (ref 6–20)
CO2: 24 mmol/L (ref 22–32)
Calcium: 9.4 mg/dL (ref 8.9–10.3)
Chloride: 102 mmol/L (ref 98–111)
Creatinine, Ser: 0.92 mg/dL (ref 0.61–1.24)
GFR, Estimated: >60 mL/min (ref >60)
Glucose, Bld: 109 mg/dL — ABNORMAL HIGH (ref 70–99)
Potassium: 4.5 mmol/L (ref 3.5–5.1)
Sodium: 136 mmol/L (ref 135–145)
Total Bilirubin: 2 mg/dL — ABNORMAL HIGH (ref 0.3–1.2)
Total Protein: 8.4 g/dL — ABNORMAL HIGH (ref 6.5–8.1)

## 2021-10-28 LAB — POCT URINALYSIS DIP (CLINITEK)
Bilirubin, UA: NEGATIVE
Blood, UA: NEGATIVE
Glucose, UA: NEGATIVE mg/dL
Ketones, POC UA: NEGATIVE mg/dL
Leukocytes, UA: NEGATIVE
Nitrite, UA: NEGATIVE
Spec Grav, UA: 1.015 (ref 1.010–1.025)
Urobilinogen, UA: 0.2 E.U./dL
pH, UA: 7 (ref 5.0–8.0)

## 2021-10-28 MED ORDER — ONDANSETRON HCL 4 MG PO TABS: 4.0000 mg | ORAL_TABLET | Freq: Four times a day (QID) | ORAL | 0 refills | Status: AC

## 2021-10-28 MED ORDER — POLYETHYLENE GLYCOL 3350 17 GM/SCOOP PO POWD: 17.0000 g | Freq: Two times a day (BID) | ORAL | 1 refills | Status: AC | PRN

## 2021-10-28 NOTE — Progress Notes (Signed)
   Subjective:    Patient ID: Brady Hayes, male    DOB: 06-18-2000, 21 y.o.   MRN: [id number]  HPI  Low abd pain w/ urination as well as low back pain with urination. ER visit on 09/07/21 kidney stone. CT noted 8mm kidney stone to left ureter. Patient states that he does not feel that he has passed the stone.   Patient denies any blood in urination, fevers, chills, or body aches. Patient admits to nausea but no vomiting.    Review of Systems  Genitourinary:  Positive for dysuria.  Musculoskeletal:  Positive for back pain.  All other systems reviewed and are negative.      Objective:   Physical Exam Vitals reviewed.  Constitutional:      General: He is not in acute distress.    Appearance: Normal appearance. He is normal weight. He is not ill-appearing, toxic-appearing or diaphoretic.  HENT:     Head: Normocephalic and atraumatic.  Cardiovascular:     Rate and Rhythm: Normal rate and regular rhythm.     Pulses: Normal pulses.     Heart sounds: Normal heart sounds. No murmur heard. Pulmonary:     Effort: Pulmonary effort is normal. No respiratory distress.     Breath sounds: Normal breath sounds. No wheezing.  Abdominal:     General: Abdomen is flat. Bowel sounds are normal. There is no distension.     Palpations: Abdomen is soft. There is no mass.     Tenderness: There is abdominal tenderness in the suprapubic area. There is no right CVA tenderness, left CVA tenderness, guarding or rebound. Negative signs include Murphy's sign, Rovsing's sign, McBurney's sign and psoas sign.     Hernia: No hernia is present.  Musculoskeletal:     Cervical back: Normal range of motion and neck supple. No rigidity or tenderness.     Comments: Grossly intact  Lymphadenopathy:     Cervical: No cervical adenopathy.  Skin:    General: Skin is warm.     Capillary Refill: Capillary refill takes less than 2 seconds.  Neurological:     Mental Status: He is alert.     Comments: Grossly intact   Psychiatric:        Mood and Affect: Mood normal.        Behavior: Behavior normal.         Assessment & Plan:   1. Kidney Stone -Will refer patient urgently to urology since there have been no improvement of symptoms in 4 weeks.  - POCT URINALYSIS DIP (CLINITEK) = negative - Ambulatory referral to Urology, urgent - Comprehensive metabolic panel, urgent to assess for changes in kidney function - Go to ED if you notice blood in urine, increased pain, vomiting, fever, body aches, chills.  - May use ibuprofen for pain management as needed.

## 2021-10-30 ENCOUNTER — Encounter: Payer: Self-pay | Admitting: Nurse Practitioner

## 2021-11-01 ENCOUNTER — Other Ambulatory Visit: Payer: Self-pay | Admitting: Physician Assistant

## 2021-11-01 ENCOUNTER — Ambulatory Visit (INDEPENDENT_AMBULATORY_CARE_PROVIDER_SITE_OTHER): Payer: 59 | Admitting: Physician Assistant

## 2021-11-01 ENCOUNTER — Ambulatory Visit (HOSPITAL_COMMUNITY)
Admission: RE | Admit: 2021-11-01 | Discharge: 2021-11-01 | Disposition: A | Discharge status: Home / Self Care | Payer: 59 | Source: Ambulatory Visit | Attending: Physician Assistant | Admitting: Physician Assistant

## 2021-11-01 VITALS — BP 124/76 | HR 73 | Resp 18

## 2021-11-01 DIAGNOSIS — N200 Calculus of kidney: Secondary | ICD-10-CM | POA: Diagnosis not present

## 2021-11-01 DIAGNOSIS — N132 Hydronephrosis with renal and ureteral calculous obstruction: Secondary | ICD-10-CM

## 2021-11-01 DIAGNOSIS — K5900 Constipation, unspecified: Secondary | ICD-10-CM

## 2021-11-01 NOTE — Patient Instructions (Signed)
Senakot S  Dulcolax suppository MiraLAX Increase water intake

## 2021-11-01 NOTE — Progress Notes (Signed)
11/01/2021 9:52 AM   Brady Hayes Jan 17, 2001 [id number]   Assessment:  1. Kidney stones - Urinalysis, Routine w reflex microscopic - DG Abd 1 View; Future  2. Hydronephrosis with urinary obstruction due to ureteral calculus  3. Constipation, unspecified constipation type    Plan: -We discussed the management of kidney stones. These options include observation, ureteroscopy, shockwave lithotripsy (ESWL) and percutaneous nephrolithotomy (PCNL). We discussed which options are relevant to the patient's stone(s). We discussed the natural history of kidney stones as well as the complications of untreated stones and the impact on quality of life without treatment as well as with each of the above listed treatments. We also discussed the efficacy of each treatment in its ability to clear the stone burden. With any of these management options I discussed the signs and symptoms of infection and the need for emergent treatment should these be experienced. For each option we discussed the ability of each procedure to clear the patient of their stone burden.   For observation I described the risks which include but are not limited to silent renal damage, life-threatening infection, need for emergent surgery, failure to pass stone and pain.   For ureteroscopy I described the risks which include bleeding, infection, damage to contiguous structures, positioning injury, ureteral stricture, ureteral avulsion, ureteral injury, need for prolonged ureteral stent, inability to perform ureteroscopy, need for an interval procedure, inability to clear stone burden, stent discomfort/pain, heart attack, stroke, pulmonary embolus and the inherent risks with general anesthesia.   For shockwave lithotripsy I described the risks which include arrhythmia, kidney contusion, kidney hemorrhage, need for transfusion, pain, inability to adequately break up stone, inability to pass stone fragments, Steinstrasse, infection  associated with obstructing stones, need for alternate surgical procedure, need for repeat shockwave lithotripsy, MI, CVA, PE and the inherent risks with anesthesia/conscious sedation.    Will resume Flomax and pt advised to increase his fluid intake and continue straining his urine.  Encouraged the patient to begin at bedtime MiraLAX for symptoms of constipation.  Stone prevention diet information given.  In 2 to 3 weeks for recheck and if he has not passed the stone in that time, we will get a KUB prior to his office visit to discuss scheduling lithotripsy versus stone manipulation.  Chief Complaint: No chief complaint on file.   Referring provider: [facility], Alvino Chapel, NP [address] [place],  [place] [address]   History of Present Illness:  Brady Hayes is a 21 y.o. year old male who is [facility] [place] [place] [employer]  for evaluation of left sided ureteral stone noted in ED on 09/07/21. The pt presented to the emergency department with complaint of left-sided flank pain radiating into the lower abdomen.  Imaging reviewed and patient noted to have 4 mm left-sided UVJ stone with mild hydroureteronephrosis.  No other stones identified.  Patient stopped Flomax and has not strain urine.  He does not feel that he has passed the stone.  He has occasional diffuse lower abdominal pain without fever, chills, nausea vomiting.  No gross hematuria.  Patient does admit to constipation over the past several weeks.  No history of stone disease, UTIs.  UA=clear KUB= possible opacity in the area of UVJ on the left. Significant stool burden noted.  Medical records including notes, lab results, and imaging studies reviewed during pt OV.  Past Medical History:  Past Medical History:  Diagnosis Date   Seizures (HCC)    As  a child; no current meds    Past Surgical History:  Past Surgical History:  Procedure Laterality Date   LAPAROSCOPIC APPENDECTOMY N/A 06/13/2017   Procedure:  APPENDECTOMY LAPAROSCOPIC;  Surgeon: Aviva Signs, MD;  Location: [facility];  Service: General;  Laterality: N/A;   TIBIA IM NAIL INSERTION Left 12/16/2018   Procedure: INTRAMEDULLARY (IM) NAIL  LEFT TIBIAL/FIBULA;  Surgeon: Meredith Pel, MD;  Location: [facility];  Service: Orthopedics;  Laterality: Left;    Allergies:  No Known Allergies  Family History:  Family History  Problem Relation Age of Onset   Diabetes Mother    Heart disease Father    Diabetes Father    Diabetes Maternal Grandmother    Diabetes Maternal Grandfather     Social History:  Social History   Tobacco Use   Smoking status: Never   Smokeless tobacco: Never  Vaping Use   Vaping Use: Never used  Substance Use Topics   Alcohol use: Not Currently   Drug use: Not Currently    Review of symptoms:  Constitutional:  Negative for unexplained weight loss, night sweats, fever, chills ENT:  Negative for nose bleeds, sinus pain, painful swallowing CV:  Negative for chest pain, shortness of breath, exercise intolerance, palpitations, loss of consciousness Resp:  Negative for cough, wheezing, shortness of breath GI:  Negative for nausea, vomiting, diarrhea, bloody stools GU:  Positives noted in HPI; otherwise negative for gross hematuria, dysuria, urinary incontinence Neuro:  Negative for seizures, poor balance, limb weakness, slurred speech Psych:  Negative for lack of energy, depression, anxiety Endocrine:  Negative for polydipsia, polyuria, symptoms of hypoglycemia (dizziness, hunger, sweating) Hematologic:  Negative for anemia, purpura, petechia, prolonged or excessive bleeding, use of anticoagulants   Physical Exam: BP 124/76   Pulse 73   Resp 18   Constitutional:  Alert and oriented, No acute distress. HEENT: NCAT, moist mucus membranes.  Trachea midline, no masses. Cardiovascular: Regular rate and rhythm without murmur, rub, or gallops No clubbing, cyanosis, or edema. Respiratory: Normal respiratory  effort, clear to auscultation bilaterally GI: Abdomen is mildly guarded, nontender, nondistended, no abdominal masses BACK:  Non-tender to palpation.  No CVAT Lymph: No cervical or inguinal lymphadenopathy. Skin: No obvious rashes, warm, dry, intact Neurologic: Alert and oriented, Cranial nerves grossly intact, no focal deficits, moving all 4 extremities. Psychiatric: Appropriate. Normal mood and affect.  Laboratory Data: No results found for this or any previous visit (from the past 24 hour(s)).  Lab Results  Component Value Date   WBC 8.0 09/07/2021   HGB 14.3 09/07/2021   HCT 41.5 09/07/2021   MCV 84.0 09/07/2021   PLT 228 09/07/2021    Lab Results  Component Value Date   CREATININE 0.92 10/28/2021    Urinalysis    Component Value Date/Time   COLORURINE YELLOW 09/07/2021 1107   APPEARANCEUR Hazy (A) 11/01/2021 1131   LABSPEC 1.031 (H) 09/07/2021 1107   PHURINE 5.0 09/07/2021 1107   GLUCOSEU Negative 11/01/2021 1131   HGBUR LARGE (A) 09/07/2021 1107   BILIRUBINUR Negative 11/01/2021 1131   KETONESUR negative 10/28/2021 1457   KETONESUR NEGATIVE 09/07/2021 1107   PROTEINUR Trace (A) 11/01/2021 1131   PROTEINUR 30 (A) 09/07/2021 1107   UROBILINOGEN 0.2 10/28/2021 1457   NITRITE Negative 11/01/2021 1131   NITRITE NEGATIVE 09/07/2021 1107   LEUKOCYTESUR Negative 11/01/2021 1131   LEUKOCYTESUR NEGATIVE 09/07/2021 1107    Lab Results  Component Value Date   LABMICR Comment 11/01/2021   BACTERIA NONE SEEN 09/07/2021  Pertinent Imaging: No results found for this or any previous visit.  No results found for this or any previous visit.     [place], Berneice Heinrich, PA-C [facility]

## 2021-11-02 LAB — URINALYSIS, ROUTINE W REFLEX MICROSCOPIC
Bilirubin, UA: NEGATIVE
Glucose, UA: NEGATIVE
Ketones, UA: NEGATIVE
Leukocytes,UA: NEGATIVE
Nitrite, UA: NEGATIVE
RBC, UA: NEGATIVE
Specific Gravity, UA: 1.025 (ref 1.005–1.030)
Urobilinogen, Ur: 1 mg/dL (ref 0.2–1.0)
pH, UA: 5.5 (ref 5.0–7.5)

## 2021-11-04 MED ORDER — TAMSULOSIN HCL 0.4 MG PO CAPS: 0.4000 mg | ORAL_CAPSULE | Freq: Every day | ORAL | 1 refills | Status: DC

## 2021-11-15 ENCOUNTER — Emergency Department (HOSPITAL_COMMUNITY)
Admission: EM | Admit: 2021-11-15 | Discharge: 2021-11-16 | Discharge status: Against Medical Advice | Payer: 59 | Attending: Emergency Medicine | Admitting: Emergency Medicine

## 2021-11-15 ENCOUNTER — Encounter (HOSPITAL_COMMUNITY): Payer: Self-pay | Admitting: Emergency Medicine

## 2021-11-15 ENCOUNTER — Other Ambulatory Visit: Payer: Self-pay

## 2021-11-15 DIAGNOSIS — R300 Dysuria: Secondary | ICD-10-CM | POA: Diagnosis not present

## 2021-11-15 DIAGNOSIS — K5900 Constipation, unspecified: Secondary | ICD-10-CM | POA: Insufficient documentation

## 2021-11-15 DIAGNOSIS — Z5321 Procedure and treatment not carried out due to patient leaving prior to being seen by health care provider: Secondary | ICD-10-CM | POA: Insufficient documentation

## 2021-11-15 DIAGNOSIS — R1030 Lower abdominal pain, unspecified: Secondary | ICD-10-CM | POA: Insufficient documentation

## 2021-11-15 DIAGNOSIS — R110 Nausea: Secondary | ICD-10-CM | POA: Diagnosis not present

## 2021-11-15 LAB — CBC
HCT: 41.1 % (ref 39.0–52.0)
Hemoglobin: 13.6 g/dL (ref 13.0–17.0)
MCH: 28.6 pg (ref 26.0–34.0)
MCHC: 33.1 g/dL (ref 30.0–36.0)
MCV: 86.3 fL (ref 80.0–100.0)
Platelets: 308 10*3/uL (ref 150–400)
RBC: 4.76 MIL/uL (ref 4.22–5.81)
RDW: 12.5 % (ref 11.5–15.5)
WBC: 13.6 10*3/uL — ABNORMAL HIGH (ref 4.0–10.5)
nRBC: 0 % (ref 0.0–0.2)

## 2021-11-15 LAB — COMPREHENSIVE METABOLIC PANEL
ALT: 57 U/L — ABNORMAL HIGH (ref 0–44)
AST: 25 U/L (ref 15–41)
Albumin: 4.6 g/dL (ref 3.5–5.0)
Alkaline Phosphatase: 52 U/L (ref 38–126)
Anion gap: 7 (ref 5–15)
BUN: 11 mg/dL (ref 6–20)
CO2: 26 mmol/L (ref 22–32)
Calcium: 9.3 mg/dL (ref 8.9–10.3)
Chloride: 102 mmol/L (ref 98–111)
Creatinine, Ser: 0.75 mg/dL (ref 0.61–1.24)
GFR, Estimated: >60 mL/min (ref >60)
Glucose, Bld: 105 mg/dL — ABNORMAL HIGH (ref 70–99)
Potassium: 3.8 mmol/L (ref 3.5–5.1)
Sodium: 135 mmol/L (ref 135–145)
Total Bilirubin: 1 mg/dL (ref 0.3–1.2)
Total Protein: 8.6 g/dL — ABNORMAL HIGH (ref 6.5–8.1)

## 2021-11-15 LAB — LIPASE, BLOOD: Lipase: 35 U/L (ref 11–51)

## 2021-11-15 NOTE — ED Triage Notes (Signed)
Pt c/o lower abd pain since last night with pain to his spine; pt reports abd pain worsens when he eats; pt c/o same x 2 weeks ago, was seen by PCP and told to take laxatives, pt reports improvement until last night, LBM today but reports constipation, pt further endorses hx of kidney stones, pain with urination and nausea

## 2021-11-17 ENCOUNTER — Ambulatory Visit (INDEPENDENT_AMBULATORY_CARE_PROVIDER_SITE_OTHER): Payer: 59 | Admitting: Physician Assistant

## 2021-11-17 ENCOUNTER — Ambulatory Visit (HOSPITAL_COMMUNITY)
Admission: RE | Admit: 2021-11-17 | Discharge: 2021-11-17 | Disposition: A | Discharge status: Home / Self Care | Payer: 59 | Source: Ambulatory Visit | Attending: Physician Assistant | Admitting: Physician Assistant

## 2021-11-17 VITALS — BP 108/74 | HR 79

## 2021-11-17 DIAGNOSIS — R109 Unspecified abdominal pain: Secondary | ICD-10-CM

## 2021-11-17 DIAGNOSIS — N200 Calculus of kidney: Secondary | ICD-10-CM

## 2021-11-17 DIAGNOSIS — N132 Hydronephrosis with renal and ureteral calculous obstruction: Secondary | ICD-10-CM | POA: Diagnosis not present

## 2021-11-17 NOTE — Progress Notes (Signed)
Assessment: 1. Kidney stones - Urinalysis, Routine w reflex microscopic - CT RENAL STONE STUDY  2. Ureteral stone with hydronephrosis - CT RENAL STONE STUDY  3. Flank pain - CT RENAL STONE STUDY    Plan: -We discussed the management of kidney stones. These options include observation, ureteroscopy, shockwave lithotripsy (ESWL) and percutaneous nephrolithotomy (PCNL). We discussed which options are relevant to the patient's stone(s). We discussed the natural history of kidney stones as well as the complications of untreated stones and the impact on quality of life without treatment as well as with each of the above listed treatments. We also discussed the efficacy of each treatment in its ability to clear the stone burden. With any of these management options I discussed the signs and symptoms of infection and the need for emergent treatment should these be experienced. For each option we discussed the ability of each procedure to clear the patient of their stone burden.   For observation I described the risks which include but are not limited to silent renal damage, life-threatening infection, need for emergent surgery, failure to pass stone and pain.   For ureteroscopy I described the risks which include bleeding, infection, damage to contiguous structures, positioning injury, ureteral stricture, ureteral avulsion, ureteral injury, need for prolonged ureteral stent, inability to perform ureteroscopy, need for an interval procedure, inability to clear stone burden, stent discomfort/pain, heart attack, stroke, pulmonary embolus and the inherent risks with general anesthesia.   For shockwave lithotripsy I described the risks which include arrhythmia, kidney contusion, kidney hemorrhage, need for transfusion, pain, inability to adequately break up stone, inability to pass stone fragments, Steinstrasse, infection associated with obstructing stones, need for alternate surgical procedure, need for  repeat shockwave lithotripsy, MI, CVA, PE and the inherent risks with anesthesia/conscious sedation.   For PCNL I described the risks including positioning injury, pneumothorax, hydrothorax, need for chest tube, inability to clear stone burden, renal laceration, arterial venous fistula or malformation, need for embolization of kidney, loss of kidney or renal function, need for repeat procedure, need for prolonged nephrostomy tube, ureteral avulsion, MI, CVA, PE and the inherent risks of general anesthesia.  Send the patient for KUB that was scheduled to have prior to this office visit.  If the stone is apparent, will schedule the patient for lithotripsy next week.  If no stone is visibly apparent when compared to recent imaging, we will repeat the CT stone study.  Discussed plan with patient and his mother who agree.  No additional pain medicine given.  If pain becomes severe again, he is encouraged to go to the emergency department.  Chief Complaint: No chief complaint on file.   HPI: Brady Hayes is a 21 y.o. male who presents for continued evaluation of left distal ureteral stone which he has not yet passed since Aug 1st. Patient states his symptoms of left flank pain resolved after his last office visit and he did not get KUB prior to today's appointment.  He continues taking Flomax.  He does report similar symptoms of severe flank pain and lower abdominal pain on the right side.  He is unsure of whether he has had fevers but denies chills or vomiting.  He has had nausea.  No gross hematuria.  UA=clear  11/01/21 Brady Hayes is a 21 y.o. year old male who is seen in consultation from [place], [place], DO  for evaluation of left sided ureteral stone noted in ED on 09/07/21. The pt presented to the emergency  department with complaint of left-sided flank pain radiating into the lower abdomen.  Imaging reviewed and patient noted to have 4 mm left-sided UVJ stone with mild hydroureteronephrosis.  No other  stones identified.  Patient stopped Flomax and has not strain urine.  He does not feel that he has passed the stone.  He has occasional diffuse lower abdominal pain without fever, chills, nausea vomiting.  No gross hematuria.  Patient does admit to constipation over the past several weeks.  No history of stone disease, UTIs.   UA=clear KUB= possible opacity in the area of UVJ on the left. Significant stool burden noted.   Portions of the above documentation were copied from a prior visit for review purposes only.  Allergies: No Known Allergies  PMH: Past Medical History:  Diagnosis Date   Seizures ([place])    As a child; no current meds    PSH: Past Surgical History:  Procedure Laterality Date   LAPAROSCOPIC APPENDECTOMY N/A 06/13/2017   Procedure: APPENDECTOMY LAPAROSCOPIC;  Surgeon: Aviva Signs, MD;  Location: [facility];  Service: General;  Laterality: N/A;   TIBIA IM NAIL INSERTION Left 12/16/2018   Procedure: INTRAMEDULLARY (IM) NAIL  LEFT TIBIAL/FIBULA;  Surgeon: Meredith Pel, MD;  Location: [facility];  Service: Orthopedics;  Laterality: Left;    SH: Social History   Tobacco Use   Smoking status: Never   Smokeless tobacco: Never  Vaping Use   Vaping Use: Every day  Substance Use Topics   Alcohol use: Yes    Comment: socially   Drug use: Not Currently    ROS: All other review of systems were reviewed and are negative except what is noted above in HPI  PE: There were no vitals taken for this visit. GENERAL APPEARANCE:  Well appearing, well developed, NAD HEENT:  Atraumatic, normocephalic NECK:  Supple. Trachea midline ABDOMEN:  Soft, non-tender, no masses, no CVAT EXTREMITIES:  Moves all extremities well NEUROLOGIC:  Alert and oriented x 3 MENTAL STATUS:  appropriate SKIN:  Warm, dry, and intact   Results: Laboratory Data: Lab Results  Component Value Date   WBC 13.6 (H) 11/15/2021   HGB 13.6 11/15/2021   HCT 41.1 11/15/2021   MCV 86.3 11/15/2021   PLT  308 11/15/2021    Lab Results  Component Value Date   CREATININE 0.75 11/15/2021    No results found for: "PSA"  No results found for: "TESTOSTERONE"  No results found for: "HGBA1C"  Urinalysis    Component Value Date/Time   COLORURINE YELLOW 09/07/2021 1107   APPEARANCEUR Hazy (A) 11/01/2021 1131   LABSPEC 1.031 (H) 09/07/2021 1107   PHURINE 5.0 09/07/2021 1107   GLUCOSEU Negative 11/01/2021 1131   HGBUR LARGE (A) 09/07/2021 1107   BILIRUBINUR Negative 11/01/2021 1131   KETONESUR negative 10/28/2021 1457   KETONESUR NEGATIVE 09/07/2021 1107   PROTEINUR Trace (A) 11/01/2021 1131   PROTEINUR 30 (A) 09/07/2021 1107   UROBILINOGEN 0.2 10/28/2021 1457   NITRITE Negative 11/01/2021 1131   NITRITE NEGATIVE 09/07/2021 1107   LEUKOCYTESUR Negative 11/01/2021 1131   LEUKOCYTESUR NEGATIVE 09/07/2021 1107    Lab Results  Component Value Date   LABMICR Comment 11/01/2021   BACTERIA NONE SEEN 09/07/2021    Pertinent Imaging: Results for orders placed in visit on 11/01/21  DG Abd 1 View  Narrative CLINICAL DATA:  Left-sided kidney stone and left flank discomfort  EXAM: ABDOMEN - 1 VIEW  COMPARISON:  CT 09/07/2021  FINDINGS: The bowel gas pattern is normal. 3 mm  stone in the distal left ureter is redemonstrated as seen on CT 09/07/2021. No additional radiopaque urinary calculi. No acute osseous abnormality.  IMPRESSION: 3 mm stone in the distal left ureter is redemonstrated as seen on CT 09/07/2021.   Electronically Signed By: Minerva Fester M.D. On: 11/01/2021 23:36  No results found for this or any previous visit.  No results found for this or any previous visit.  No results found for this or any previous visit.  No results found for this or any previous visit.  No valid procedures specified. No results found for this or any previous visit.  Results for orders placed during the hospital encounter of 09/07/21  CT Renal Stone  Study  Narrative CLINICAL DATA:  LEFT flank pain for 1 day without hematuria, question kidney stone  EXAM: CT ABDOMEN AND PELVIS WITHOUT CONTRAST  TECHNIQUE: Multidetector CT imaging of the abdomen and pelvis was performed following the standard protocol without IV contrast.  RADIATION DOSE REDUCTION: This exam was performed according to the departmental dose-optimization program which includes automated exposure control, adjustment of the mA and/or kV according to patient size and/or use of iterative reconstruction technique.  COMPARISON:  06/13/2017  FINDINGS: Lower chest: Lung bases clear  Hepatobiliary: Diffuse fatty infiltration of liver with focal sparing adjacent to gallbladder fossa. No hepatic mass. Gallbladder unremarkable.  Pancreas: Normal appearance  Spleen: Normal appearance  Adrenals/Urinary Tract: Adrenal glands normal appearance. LEFT hydronephrosis and hydroureter secondary to a 3 mm distal LEFT ureteral calculus image 87. Mild perinephric edema LEFT kidney. RIGHT kidney and RIGHT ureter unremarkable. No renal masses or additional urinary tract calculi. Bladder unremarkable.  Stomach/Bowel: Appendix surgically absent. Single sigmoid diverticulum. Stomach and bowel loops otherwise normal appearance.  Vascular/Lymphatic: Aorta normal caliber.  No adenopathy.  Reproductive: Unremarkable prostate gland and seminal vesicles  Other: No free air or free fluid.  No hernia.  Musculoskeletal: Unremarkable  IMPRESSION: LEFT hydronephrosis and hydroureter secondary to a 3 mm distal LEFT ureteral calculus.  Fatty infiltration of liver.   Electronically Signed By: Ulyses Southward M.D. On: 09/07/2021 13:03  No results found for this or any previous visit (from the past 24 hour(s)).

## 2021-11-18 ENCOUNTER — Telehealth: Payer: Self-pay

## 2021-11-18 LAB — URINALYSIS, ROUTINE W REFLEX MICROSCOPIC
Bilirubin, UA: NEGATIVE
Glucose, UA: NEGATIVE
Ketones, UA: NEGATIVE
Leukocytes,UA: NEGATIVE
Nitrite, UA: NEGATIVE
Protein,UA: NEGATIVE
RBC, UA: NEGATIVE
Specific Gravity, UA: 1.025 (ref 1.005–1.030)
Urobilinogen, Ur: 0.2 mg/dL (ref 0.2–1.0)
pH, UA: 5 (ref 5.0–7.5)

## 2021-11-18 NOTE — Telephone Encounter (Signed)
Patient notified of results and recommendations. Voiced understanding.

## 2021-11-18 NOTE — Telephone Encounter (Signed)
-----   Message from Reynaldo Minium, [place] sent at 11/17/2021  3:48 PM EDT ----- Please let pt know his KUB does not show a definite stone. Certainly nothing on the right side where he has been hurting. Recommend repeat CT Stone study to be sure. He is fine to work. If pain escalates to the point of needing Rx pain meds, he should go to ED.

## 2021-11-24 ENCOUNTER — Ambulatory Visit (HOSPITAL_COMMUNITY)
Admission: RE | Admit: 2021-11-24 | Discharge: 2021-11-24 | Disposition: A | Discharge status: Home / Self Care | Payer: 59 | Source: Ambulatory Visit | Attending: Physician Assistant | Admitting: Physician Assistant

## 2021-11-24 DIAGNOSIS — N132 Hydronephrosis with renal and ureteral calculous obstruction: Secondary | ICD-10-CM | POA: Diagnosis present

## 2021-11-24 DIAGNOSIS — N200 Calculus of kidney: Secondary | ICD-10-CM | POA: Insufficient documentation

## 2021-11-24 DIAGNOSIS — R109 Unspecified abdominal pain: Secondary | ICD-10-CM | POA: Diagnosis present

## 2021-11-29 ENCOUNTER — Telehealth: Payer: Self-pay | Admitting: Physician Assistant

## 2021-11-29 ENCOUNTER — Other Ambulatory Visit: Payer: Self-pay | Admitting: Physician Assistant

## 2021-11-29 MED ORDER — METRONIDAZOLE 500 MG PO TABS: 500.0000 mg | ORAL_TABLET | Freq: Three times a day (TID) | ORAL | 0 refills | Status: AC

## 2021-11-29 MED ORDER — SULFAMETHOXAZOLE-TRIMETHOPRIM 800-160 MG PO TABS: 1.0000 | ORAL_TABLET | Freq: Two times a day (BID) | ORAL | 0 refills | Status: AC

## 2021-11-29 NOTE — Telephone Encounter (Signed)
I spoke with the patient concerning CT stone study report which just became available today.  After reviewing images, I agree that there is no urologic explanation for the patient's persistent right lower quadrant pain.  He does however have what appears to be diverticulitis with changes consistent with possible early abscess.  Patient states pain has improved significantly and he really only feels it if he is doing heavy labor.  He has had no fevers or diarrhea.  Prescriptions for metronidazole and Bactrim DS sent to the patient's pharmacy and he is instructed on how to take them.  Discussed diverticulitis and the possibility of an early abscess at length with the patient.  He is advised to follow-up with his primary care provider as soon as possible and is given strict ED precautions should his pain recur or if he develops GI symptoms or fever.  Patient conveys understanding and agrees.

## 2022-02-06 ENCOUNTER — Encounter (HOSPITAL_COMMUNITY): Payer: Self-pay | Admitting: *Deleted

## 2022-02-06 ENCOUNTER — Other Ambulatory Visit: Payer: Self-pay

## 2022-02-06 ENCOUNTER — Emergency Department (HOSPITAL_COMMUNITY)
Admission: EM | Admit: 2022-02-06 | Discharge: 2022-02-06 | Disposition: A | Discharge status: Home / Self Care | Payer: 59 | Attending: Emergency Medicine | Admitting: Emergency Medicine

## 2022-02-06 ENCOUNTER — Emergency Department (HOSPITAL_COMMUNITY): Payer: 59

## 2022-02-06 DIAGNOSIS — S93401A Sprain of unspecified ligament of right ankle, initial encounter: Secondary | ICD-10-CM | POA: Diagnosis not present

## 2022-02-06 DIAGNOSIS — M25571 Pain in right ankle and joints of right foot: Secondary | ICD-10-CM | POA: Diagnosis present

## 2022-02-06 DIAGNOSIS — W19XXXA Unspecified fall, initial encounter: Secondary | ICD-10-CM | POA: Diagnosis not present

## 2022-02-06 MED ORDER — IBUPROFEN 800 MG PO TABS
800.0000 mg | ORAL_TABLET | Freq: Once | ORAL | Status: AC
  Administered 2022-02-06: 800 mg via ORAL
  Filled 2022-02-06: qty 1

## 2022-02-06 NOTE — ED Notes (Signed)
Air cast boot placed per order/ Order form filled out and signed by patient. Pt expressed understanding of care needed to maintain boot integrity. Pt reports boot fits well and is comfortable. Awaiting DC orders.

## 2022-02-06 NOTE — Discharge Instructions (Addendum)
Evaluation of your ankle revealed that you likely have a right ankle sprain.  Recommend that you continue to wear the brace for the next 2 to 3 days.  If swelling and pain improves you can attempt to try to bear some weight on that ankle.  If symptoms persist recommend that you follow-up with [facility].  I have provided their contact information in your discharge summary.

## 2022-02-06 NOTE — ED Provider Notes (Signed)
[facility] EMERGENCY DEPARTMENT Provider Note   CSN: [id number] Arrival date & time: 02/06/22  1036     History  Chief Complaint  Patient presents with   Ankle Pain   HPI Brady Hayes is a 21 y.o. male presenting for ankle pain.  Patient was running around in the dark in the backyard last night when he fell and twisted his right ankle.  Ankle was immediately painful and started to swell.  He has been unable to bear weight.  Some crutches at home and was able to ambulate in the house and stated.  States he is having no issues wiggling his toes and sensation is normal.  Not taking the medications to alleviate his pain/swelling.   Ankle Pain      Home Medications Prior to Admission medications   Medication Sig Start Date End Date Taking? Authorizing Provider  ondansetron (ZOFRAN) 4 MG tablet Take 1 tablet (4 mg total) by mouth every 6 (six) hours. As needed for nausea/vomiting 10/28/21   [employer], Alvino Chapel, FNP  oxyCODONE-acetaminophen (PERCOCET/ROXICET) 5-325 MG tablet Take 1 tablet by mouth every 4 (four) hours as needed. Patient not taking: Reported on 11/17/2021 09/07/21   Triplett, Tammy, PA-C  polyethylene glycol powder (GLYCOLAX/MIRALAX) 17 GM/SCOOP powder Take 17 g by mouth 2 (two) times daily as needed. 10/28/21   [employer], Alvino Chapel, FNP      Allergies    Patient has no known allergies.    Review of Systems   Review of Systems  Musculoskeletal:        Right ankle pain and swelling    Physical Exam Updated Vital Signs BP 128/82 (BP Location: Right Arm)   Pulse 86   Temp 98.6 F (37 C) (Oral)   Resp 18   Ht 5\' 9"  (1.753 m)   Wt 91.6 kg   SpO2 99%   BMI 29.83 kg/m  Physical Exam Constitutional:      Appearance: Normal appearance.  HENT:     Head: Normocephalic.     Nose: Nose normal.  Eyes:     Conjunctiva/sclera: Conjunctivae normal.  Cardiovascular:     Pulses:          Dorsalis pedis pulses are 2+ on the right side and 2+ on the left side.   Pulmonary:     Effort: Pulmonary effort is normal.  Musculoskeletal:     Right foot: Decreased range of motion. Swelling and tenderness present.     Left foot: Normal.     Comments: Swelling and tenderness noted about the right lateral malleolus.  Patient refused to attempt bearing weight due to pain.  Neurological:     Mental Status: He is alert.  Psychiatric:        Mood and Affect: Mood normal.     ED Results / Procedures / Treatments   Labs (all labs ordered are listed, but only abnormal results are displayed) Labs Reviewed - No data to display  EKG None  Radiology DG Ankle Complete Right  Result Date: 02/06/2022 CLINICAL DATA:  Fall.  Lateral ankle pain and bruising. EXAM: RIGHT ANKLE - COMPLETE 3+ VIEW COMPARISON:  None Available. FINDINGS: Moderate lateral and mild anterior ankle soft tissue swelling. Normal bone mineralization. The ankle mortise is symmetric and intact. No acute fracture is seen. No dislocation. IMPRESSION: Moderate lateral and mild anterior ankle soft tissue swelling. No acute fracture. Electronically Signed   By: 03-05-1971 M.D.   On: 02/06/2022 11:57    Procedures Procedures  Medications Ordered in ED Medications  ibuprofen (ADVIL) tablet 800 mg (800 mg Oral Given 02/06/22 1258)    ED Course/ Medical Decision Making/ A&P                           Medical Decision Making Amount and/or Complexity of Data Reviewed Radiology: ordered.  Risk Prescription drug management.   21 year old male who is well-appearing and hemodynamically stable presenting for ankle injury.  Physical exam was notable for swelling about the lateral malleolus of the right foot. I reviewed and interpreted ankle x-ray which did not reveal fracture or dislocation but did reveal some lateral swelling about the right ankle.  Likely that he has sprained his ankle. Treated with ibuprofen and ankle Aircast.  Advised conservative treatment at home.  Also advised that he  follow-up with [facility].        Final Clinical Impression(s) / ED Diagnoses Final diagnoses:  Sprain of right ankle, unspecified ligament, initial encounter    Rx / DC Orders ED Discharge Orders     None         Gareth Eagle, PA-C 02/06/22 1312    Rondel Baton, MD 02/09/22 [phone]

## 2022-02-06 NOTE — ED Triage Notes (Signed)
Pt states he was running last night and fell twisting his right ankle; pt has bruising to outer ankle and pain with movement
